# Patient Record
Sex: Male | Born: 1980 | Race: White | Hispanic: No | Marital: Single | Smoking: Never smoker
Health system: Southern US, Community
[De-identification: ages and names within clinical notes are randomized; demographics above are authoritative.]

---

## 2005-07-03 ENCOUNTER — Emergency Department (HOSPITAL_COMMUNITY): Admission: EM | Admit: 2005-07-03 | Discharge: 2005-07-03 | Payer: Self-pay | Admitting: Emergency Medicine

## 2005-07-04 ENCOUNTER — Ambulatory Visit (HOSPITAL_COMMUNITY): Admission: RE | Admit: 2005-07-04 | Discharge: 2005-07-04 | Payer: Self-pay | Admitting: Orthopedic Surgery

## 2005-07-08 ENCOUNTER — Ambulatory Visit: Payer: Self-pay | Admitting: Emergency Medicine

## 2005-07-08 ENCOUNTER — Inpatient Hospital Stay (HOSPITAL_COMMUNITY): Admission: EM | Admit: 2005-07-08 | Discharge: 2005-07-10 | Payer: Self-pay | Admitting: Emergency Medicine

## 2006-03-11 ENCOUNTER — Inpatient Hospital Stay (HOSPITAL_COMMUNITY): Admission: EM | Admit: 2006-03-11 | Discharge: 2006-03-16 | Payer: Self-pay | Admitting: Emergency Medicine

## 2006-03-17 ENCOUNTER — Ambulatory Visit: Payer: Self-pay | Admitting: Gastroenterology

## 2007-11-11 IMAGING — CT CT ABDOMEN W/ CM
2 of 6 series · 17 of 46 positions shown, 19 images · IV contrast (omnipaque)
Comparison: None relevant.

CLINICAL DATA: Central abdominal pain.  Evaluate for appendicitis. 
ABDOMEN CT WITH CONTRAST:
TECHNIQUE: Multidetector CT imaging of the abdomen was performed following the standard protocol during bolus administration of intravenous contrast.
Contrast:  125 cc Omnipaque 300.  Oral contrast was given.
TECHNIQUE: Multidetector CT imaging of the pelvis was performed following the standard protocol during bolus administration of intravenous contrast.

[Series 2: abd_pel 5.0 b40s · axial · 0.74mm/px · z∈[-480,-70]mm · 14 of 94 slices shown, 16 images]
[im 6/94  soft-tissue]
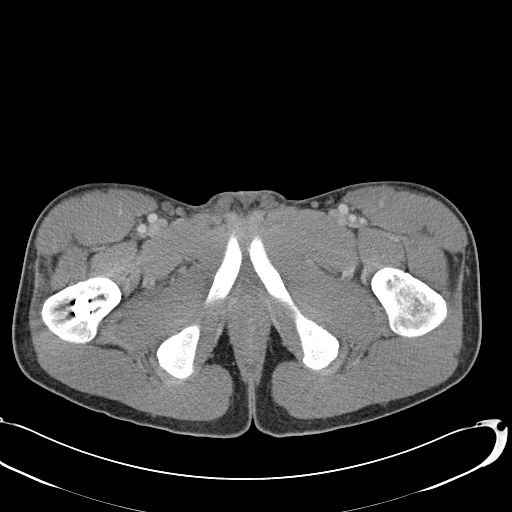
[im 6/94  bone]
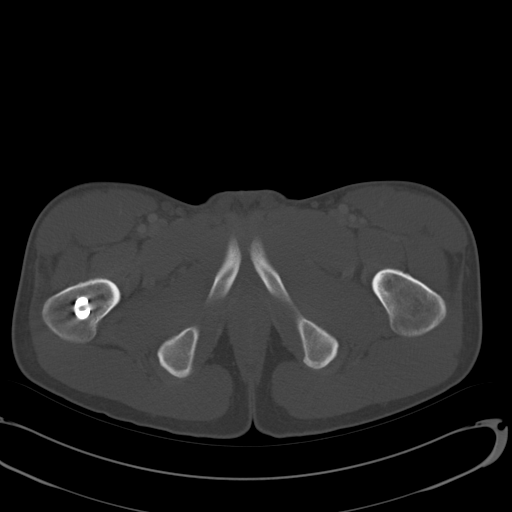
[im 11/94  soft-tissue]
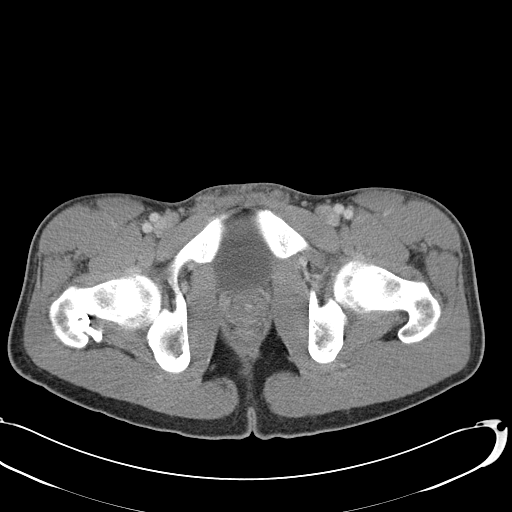
[im 21/94  soft-tissue]
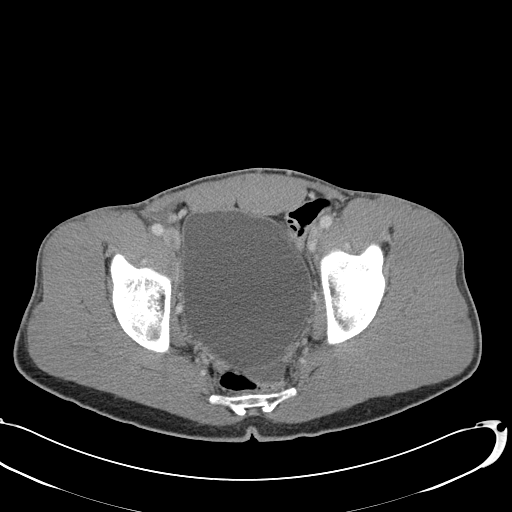
[im 26/94  soft-tissue]
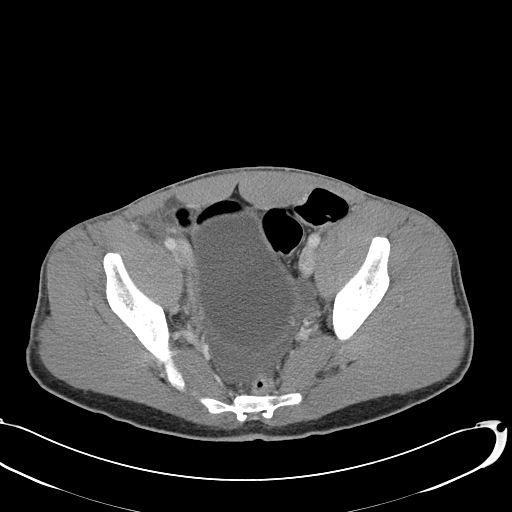
[im 32/94  soft-tissue]
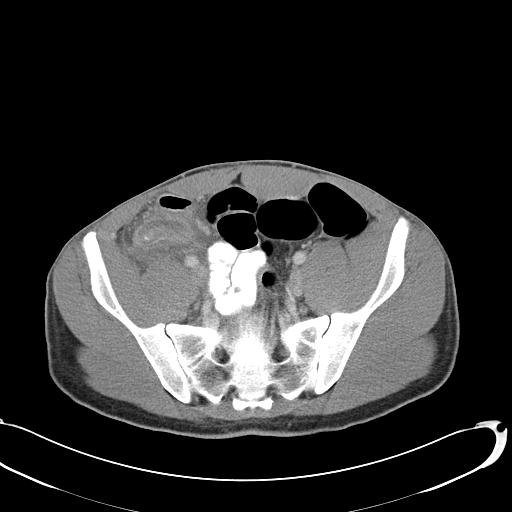
[im 37/94  soft-tissue]
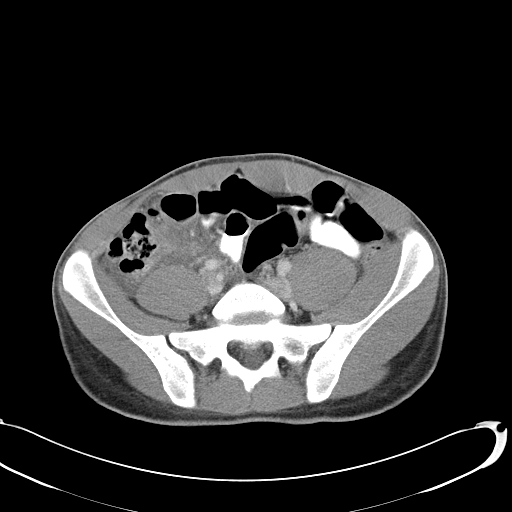
[im 42/94  soft-tissue]
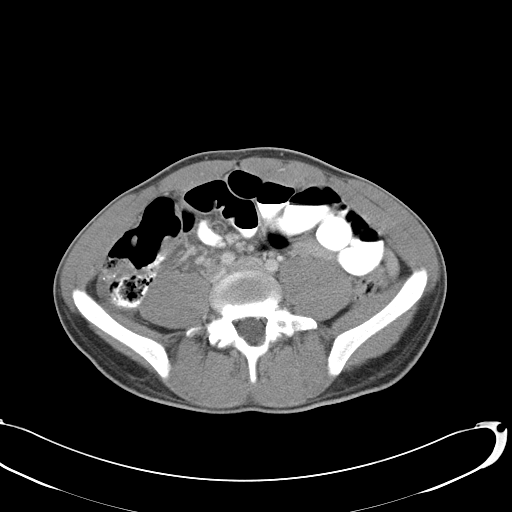
[im 52/94  soft-tissue]
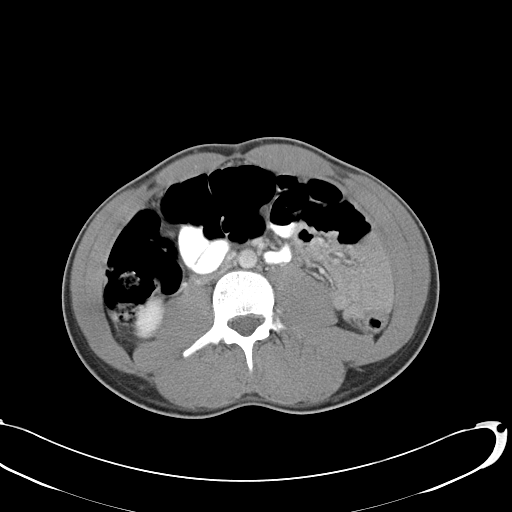
[im 57/94  soft-tissue]
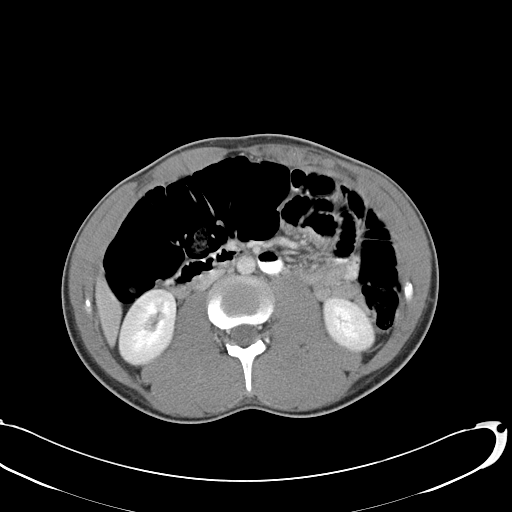
[im 57/94  bone]
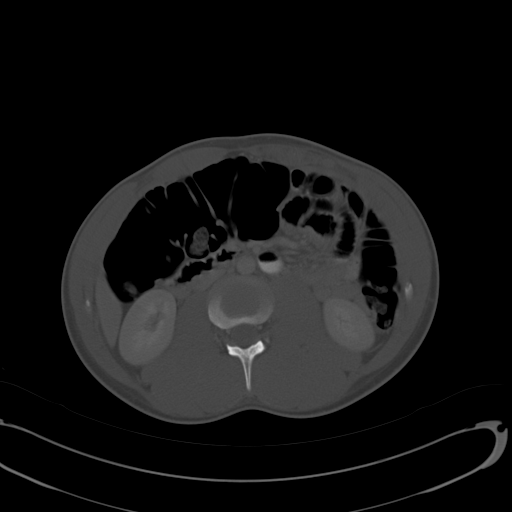
[im 63/94  soft-tissue]
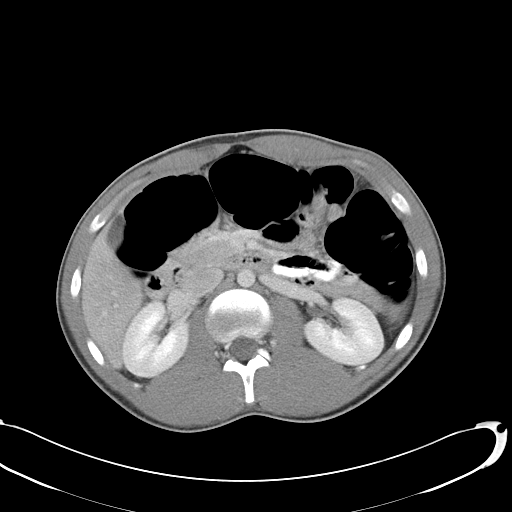
[im 68/94  soft-tissue]
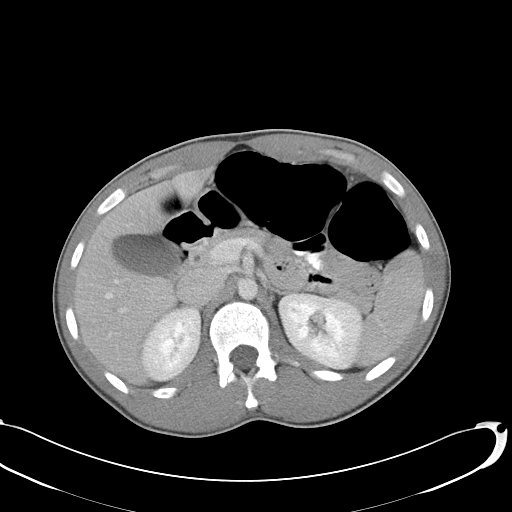
[im 73/94  soft-tissue]
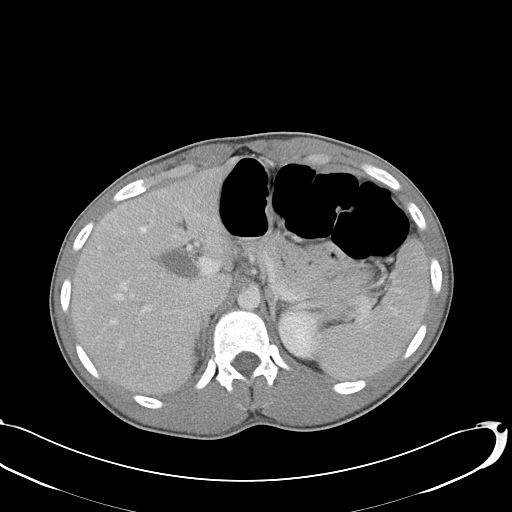
[im 83/94  soft-tissue]
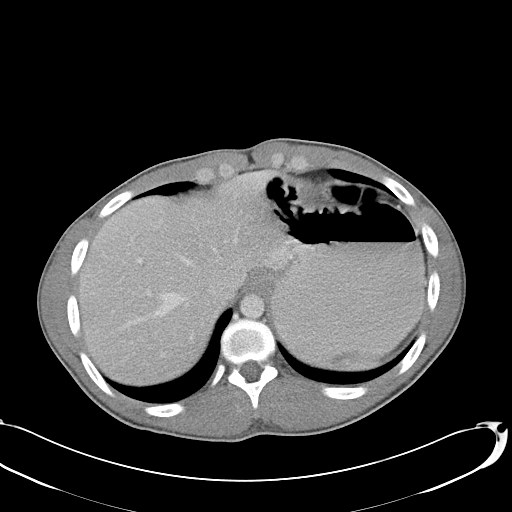
[im 88/94  soft-tissue]
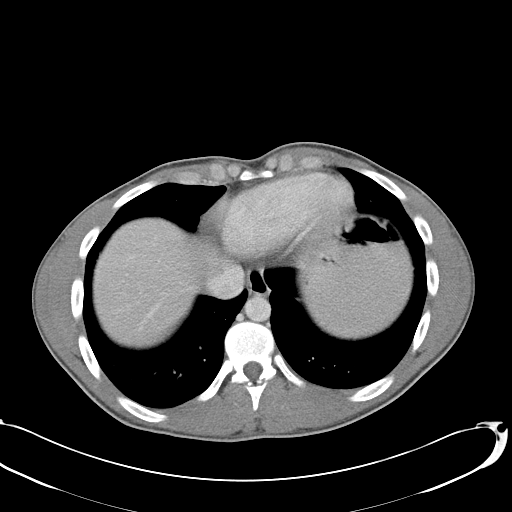

[Series 6: mpr coronal a/p · coronal · 0.96mm/px · 3 of 65 slices shown]
[im 22/65  soft-tissue]
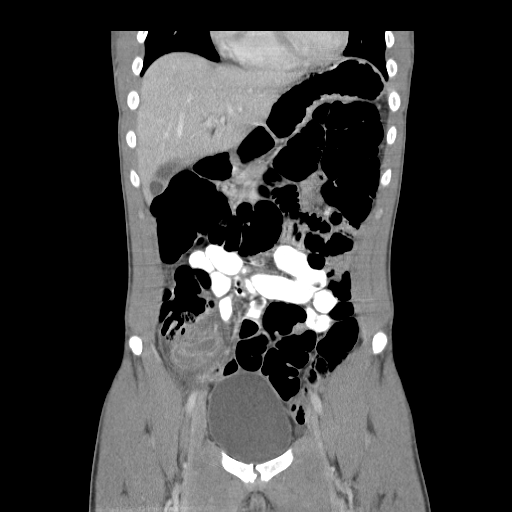
[im 29/65  soft-tissue]
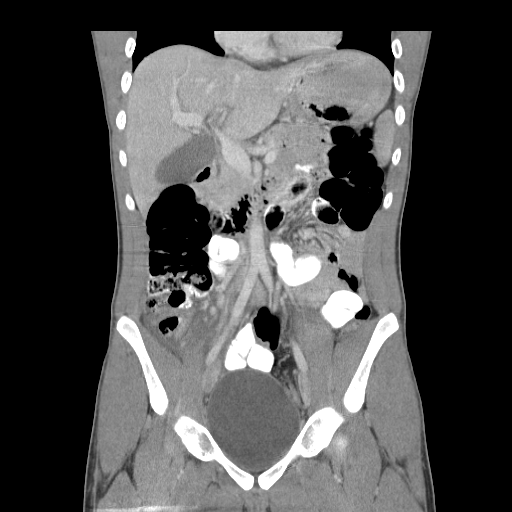
[im 36/65  soft-tissue]
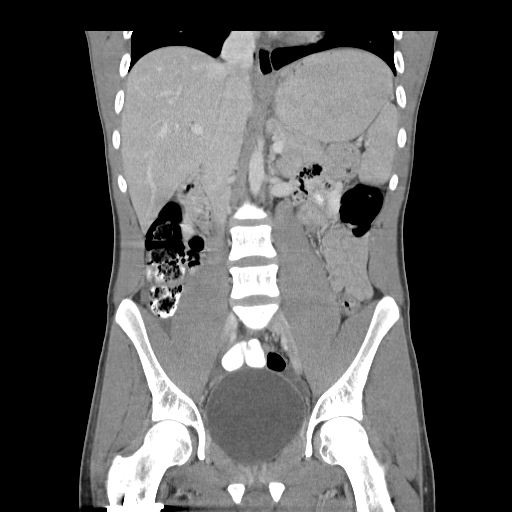

[17 of 46 positions shown; findings below may reference images not displayed]

FINDINGS: The lung bases are clear.   There is no pleural effusion.  The liver, spleen, pancreas, adrenal glands and kidneys appear normal.  The gallbladder shows a phrygian cap, but otherwise appears normal.  No intraabdominal inflammatory changes are identified.
IMPRESSION: Negative CT of the abdomen. 
PELVIS CT WITH CONTRAST:
FINDINGS: There is marked distention of the appendix with a diameter of up to 1.5 cm.  There is thickening of the walls of the appendix with inflammatory change in the surrounding fat.  There may be a small appendicolith on image 63.  Moderate free pelvic fluid is present eccentric to the right.  There is no focal extraluminal fluid collection, although appendiceal rupture is a definite possibility.  A unilateral pars defect is noted on the left at L-5.
IMPRESSION: Acute appendicitis with significant periappendiceal inflammation and free pelvic fluid.  Appendiceal perforation is a definite consideration.  No abscess is identified. 
Findings have been reviewed with Dr Nataala.

## 2015-05-04 ENCOUNTER — Ambulatory Visit (INDEPENDENT_AMBULATORY_CARE_PROVIDER_SITE_OTHER): Payer: BLUE CROSS/BLUE SHIELD | Admitting: Family Medicine

## 2015-05-04 VITALS — BP 128/80 | HR 84 | Temp 98.5°F | Resp 17 | Ht 73.0 in | Wt 164.0 lb

## 2015-05-04 DIAGNOSIS — Z0000 Encounter for general adult medical examination without abnormal findings: Secondary | ICD-10-CM | POA: Diagnosis not present

## 2015-05-04 LAB — LIPID PANEL
CHOL/HDL RATIO: 2.8 ratio (ref <=5.0)
CHOLESTEROL: 185 mg/dL (ref 125–200)
HDL: 65 mg/dL (ref >=40)
LDL Cholesterol: 106 mg/dL (ref <130)
TRIGLYCERIDES: 68 mg/dL (ref <150)
VLDL: 14 mg/dL (ref <30)

## 2015-05-04 LAB — COMPREHENSIVE METABOLIC PANEL
ALBUMIN: 4.6 g/dL (ref 3.6–5.1)
ALT: 14 U/L (ref 9–46)
AST: 17 U/L (ref 10–40)
Alkaline Phosphatase: 59 U/L (ref 40–115)
BUN: 12 mg/dL (ref 7–25)
CALCIUM: 9.8 mg/dL (ref 8.6–10.3)
CHLORIDE: 102 mmol/L (ref 98–110)
CO2: 29 mmol/L (ref 20–31)
CREATININE: 0.98 mg/dL (ref 0.60–1.35)
Glucose, Bld: 82 mg/dL (ref 65–99)
Potassium: 4.8 mmol/L (ref 3.5–5.3)
SODIUM: 139 mmol/L (ref 135–146)
TOTAL PROTEIN: 7.2 g/dL (ref 6.1–8.1)
Total Bilirubin: 1.7 mg/dL — ABNORMAL HIGH (ref 0.2–1.2)

## 2015-05-04 LAB — POCT CBC
GRANULOCYTE PERCENT: 70.6 % (ref 37–80)
HEMATOCRIT: 42.8 % — AB (ref 43.5–53.7)
Hemoglobin: 15.5 g/dL (ref 14.1–18.1)
LYMPH, POC: 1.3 (ref 0.6–3.4)
MCH, POC: 32.6 pg — AB (ref 27–31.2)
MCHC: 36.1 g/dL — AB (ref 31.8–35.4)
MCV: 90.2 fL (ref 80–97)
MID (CBC): 0.2 (ref 0–0.9)
MPV: 7.6 fL (ref 0–99.8)
POC GRANULOCYTE: 3.7 (ref 2–6.9)
POC LYMPH %: 25 % (ref 10–50)
POC MID %: 4.4 % (ref 0–12)
Platelet Count, POC: 216 10*3/uL (ref 142–424)
RBC: 4.74 M/uL (ref 4.69–6.13)
RDW, POC: 12.9 %
WBC: 5.3 10*3/uL (ref 4.6–10.2)

## 2015-05-04 LAB — TSH: TSH: 1.224 u[IU]/mL (ref 0.350–4.500)

## 2015-05-04 NOTE — Patient Instructions (Signed)
Continue to seek to make wise choices to be physically, emotionally, relationally, and spiritually healthy.  Return at any time if needed

## 2015-05-04 NOTE — Progress Notes (Signed)
Patient ID: Douglas Allison, male    DOB: 25-Jun-1980  Age: 34 y.o. MRN: [id number]  Chief Complaint  Patient presents with  . Employment Physical    Subjective:   34 year old man here for a annual physical exam. Generally he is healthy with no major concerns.  Past medical history: Operations: Appendectomy; fracture right femur in sports in college, no subsequent problems Medical illnesses: None Allergies: None. He did have some seasonal allergies when he was younger but that is not an issue related now. Regular medications: None Medications: Head is flu shot already  Family history: Both parents are living. Father is had some high cholesterol and blood pressure. One brother and one sister living and well except brother had some asthma.  Social history: Happily married, 1 child with a second child on the way. Does not smoke or use substances. Has 1 drink most evenings. 7-10 per week. Runs a couple of days a week.  Current allergies, medications, problem list, past/family and social histories reviewed in the chart also.  Review of systems: Constitutional: Unremarkable HEENT: Unremarkable Cardiovascular: Unremarkable Respiratory: Unremarkable GI: Unremarkable GU: Unremarkable Musculoskeletal: Unremarkable Neurologic: Unremarkable Psychiatric: Unremarkable Dermatologic: Unremarkable Endocrinologic: Unremarkable    Objective:  BP 128/80 mmHg  Pulse 84  Temp(Src) 98.5 F (36.9 C) (Oral)  Resp 17  Ht 6\' 1"  (1.854 m)  Wt 164 lb (74.39 kg)  BMI 21.64 kg/m2  SpO2 98%  Healthy-appearing young man in no acute distress. HEENT normal TMs. Eyes PERRLA. Throat clear. Neck supple without nodes or thyromegaly. No carotid bruits. Chest is clear to auscultation. Heart regular without murmurs gallops or arrhythmias. Am soft without mass or tenderness. Normal male external genitalia with testes descended. No hernias. Extremities normal. Skin normal.  Assessment & Plan:   Assessment: 1.  Annual physical exam       Plan: We'll let him know the results of his labs. He had hepatitis testing done when he was hospitalized at [facility] long for his appendectomy. He has been mutually monogamous and does feel like he needs any CD testing at this time.  Orders Placed This Encounter  Procedures  . Comprehensive metabolic panel  . TSH  . Lipid panel  . POCT CBC          Patient Instructions  Continue to seek to make wise choices to be physically, emotionally, relationally, and spiritually healthy.  Return at any time if needed     Return in about 1 year (around 05/03/2016), or if symptoms worsen or fail to improve.   Aija Scarfo, MD 05/04/2015

## 2015-11-15 ENCOUNTER — Other Ambulatory Visit (INDEPENDENT_AMBULATORY_CARE_PROVIDER_SITE_OTHER): Payer: Self-pay | Admitting: Otolaryngology

## 2015-11-15 DIAGNOSIS — J0101 Acute recurrent maxillary sinusitis: Secondary | ICD-10-CM

## 2015-11-23 ENCOUNTER — Ambulatory Visit
Admission: RE | Admit: 2015-11-23 | Discharge: 2015-11-23 | Disposition: A | Discharge status: Home / Self Care | Payer: BLUE CROSS/BLUE SHIELD | Source: Ambulatory Visit | Attending: Otolaryngology | Admitting: Otolaryngology

## 2015-11-23 DIAGNOSIS — J0101 Acute recurrent maxillary sinusitis: Secondary | ICD-10-CM

## 2020-10-14 DIAGNOSIS — Z20822 Contact with and (suspected) exposure to covid-19: Secondary | ICD-10-CM | POA: Diagnosis not present

## 2020-12-12 ENCOUNTER — Ambulatory Visit: Payer: BLUE CROSS/BLUE SHIELD | Admitting: Family Medicine

## 2021-04-03 DIAGNOSIS — S0502XA Injury of conjunctiva and corneal abrasion without foreign body, left eye, initial encounter: Secondary | ICD-10-CM | POA: Diagnosis not present

## 2021-05-03 DIAGNOSIS — Z0000 Encounter for general adult medical examination without abnormal findings: Secondary | ICD-10-CM | POA: Diagnosis not present

## 2021-05-03 DIAGNOSIS — Z125 Encounter for screening for malignant neoplasm of prostate: Secondary | ICD-10-CM | POA: Diagnosis not present

## 2021-05-03 DIAGNOSIS — Z13220 Encounter for screening for lipoid disorders: Secondary | ICD-10-CM | POA: Diagnosis not present

## 2021-05-03 DIAGNOSIS — Z1329 Encounter for screening for other suspected endocrine disorder: Secondary | ICD-10-CM | POA: Diagnosis not present

## 2022-05-05 DIAGNOSIS — Z125 Encounter for screening for malignant neoplasm of prostate: Secondary | ICD-10-CM | POA: Diagnosis not present

## 2022-05-05 DIAGNOSIS — Z0000 Encounter for general adult medical examination without abnormal findings: Secondary | ICD-10-CM | POA: Diagnosis not present

## 2022-09-01 ENCOUNTER — Encounter: Payer: Self-pay | Admitting: *Deleted
